# Patient Record
Sex: Female | Born: 2005 | Race: White | Hispanic: No | Marital: Single | Smoking: Never smoker
Health system: Southern US, Community
[De-identification: ages and names within clinical notes are randomized; demographics above are authoritative.]

## PROBLEM LIST (undated history)

## (undated) DIAGNOSIS — R519 Headache, unspecified: Secondary | ICD-10-CM

## (undated) DIAGNOSIS — Z9109 Other allergy status, other than to drugs and biological substances: Secondary | ICD-10-CM

## (undated) DIAGNOSIS — G43909 Migraine, unspecified, not intractable, without status migrainosus: Secondary | ICD-10-CM

## (undated) DIAGNOSIS — J45909 Unspecified asthma, uncomplicated: Secondary | ICD-10-CM

## (undated) DIAGNOSIS — F319 Bipolar disorder, unspecified: Secondary | ICD-10-CM

## (undated) DIAGNOSIS — L309 Dermatitis, unspecified: Secondary | ICD-10-CM

## (undated) DIAGNOSIS — F940 Selective mutism: Secondary | ICD-10-CM

## (undated) DIAGNOSIS — F419 Anxiety disorder, unspecified: Secondary | ICD-10-CM

## (undated) DIAGNOSIS — F909 Attention-deficit hyperactivity disorder, unspecified type: Secondary | ICD-10-CM

## (undated) DIAGNOSIS — F840 Autistic disorder: Secondary | ICD-10-CM

## (undated) HISTORY — DX: Headache, unspecified: R51.9

## (undated) HISTORY — DX: Migraine, unspecified, not intractable, without status migrainosus: G43.909

## (undated) HISTORY — PX: ADENOIDECTOMY: SUR15

## (undated) HISTORY — PX: OTHER SURGICAL HISTORY: SHX169

## (undated) HISTORY — PX: TONSILLECTOMY: SUR1361

---
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
## 2024-01-22 ENCOUNTER — Telehealth: Payer: Self-pay | Admitting: Pediatrics

## 2024-01-22 NOTE — Telephone Encounter (Signed)
 Patient's father stated that Dr. Kennyth spoke with patient's mother, who is a patient of Dr. Shaune currently and was told he could take on patient as a new patient. Can this be confirmed?

## 2024-01-23 NOTE — Telephone Encounter (Signed)
 Ok to schedule a new pt appt Per Dr Kennyth

## 2024-02-01 NOTE — Telephone Encounter (Signed)
 LVM to schedule pt.

## 2024-03-13 ENCOUNTER — Ambulatory Visit: Admitting: Family Medicine

## 2024-03-13 ENCOUNTER — Encounter: Payer: Self-pay | Admitting: Family Medicine

## 2024-03-13 VITALS — BP 130/84 | HR 72 | Temp 98.0°F | Ht 66.0 in | Wt 281.0 lb

## 2024-03-13 DIAGNOSIS — F419 Anxiety disorder, unspecified: Secondary | ICD-10-CM

## 2024-03-13 DIAGNOSIS — L732 Hidradenitis suppurativa: Secondary | ICD-10-CM

## 2024-03-13 DIAGNOSIS — N926 Irregular menstruation, unspecified: Secondary | ICD-10-CM | POA: Insufficient documentation

## 2024-03-13 DIAGNOSIS — Z131 Encounter for screening for diabetes mellitus: Secondary | ICD-10-CM | POA: Diagnosis not present

## 2024-03-13 DIAGNOSIS — G43909 Migraine, unspecified, not intractable, without status migrainosus: Secondary | ICD-10-CM | POA: Insufficient documentation

## 2024-03-13 DIAGNOSIS — Z13220 Encounter for screening for lipoid disorders: Secondary | ICD-10-CM

## 2024-03-13 DIAGNOSIS — J309 Allergic rhinitis, unspecified: Secondary | ICD-10-CM

## 2024-03-13 DIAGNOSIS — E039 Hypothyroidism, unspecified: Secondary | ICD-10-CM

## 2024-03-13 DIAGNOSIS — F840 Autistic disorder: Secondary | ICD-10-CM

## 2024-03-13 DIAGNOSIS — L309 Dermatitis, unspecified: Secondary | ICD-10-CM

## 2024-03-13 DIAGNOSIS — F909 Attention-deficit hyperactivity disorder, unspecified type: Secondary | ICD-10-CM | POA: Diagnosis not present

## 2024-03-13 DIAGNOSIS — L709 Acne, unspecified: Secondary | ICD-10-CM | POA: Diagnosis not present

## 2024-03-13 DIAGNOSIS — E063 Autoimmune thyroiditis: Secondary | ICD-10-CM | POA: Diagnosis not present

## 2024-03-13 DIAGNOSIS — J45909 Unspecified asthma, uncomplicated: Secondary | ICD-10-CM | POA: Insufficient documentation

## 2024-03-13 DIAGNOSIS — G43801 Other migraine, not intractable, with status migrainosus: Secondary | ICD-10-CM

## 2024-03-13 MED ORDER — FLUOCINOLONE ACETONIDE 0.01 % OT OIL: 0.5000 mL | TOPICAL_OIL | Freq: Two times a day (BID) | OTIC | 0 refills | Status: AC

## 2024-03-13 MED ORDER — ALBUTEROL SULFATE HFA 108 (90 BASE) MCG/ACT IN AERS: 2.0000 | INHALATION_SPRAY | Freq: Four times a day (QID) | RESPIRATORY_TRACT | 3 refills | Status: AC | PRN

## 2024-03-13 NOTE — Assessment & Plan Note (Signed)
 BMI 45.35 today. This is probably due to her untreated hypothyroidism though we are checking additional labs today as above.  She may benefit from starting a GLP agonist however we will hold off on this until we get above labs back.

## 2024-03-13 NOTE — Patient Instructions (Signed)
 It was very nice to see you today!  VISIT SUMMARY: Today, we addressed your concerns about weight gain, hormonal issues, migraines, and other ongoing health conditions. We have ordered comprehensive blood work and referred you to an adult endocrinologist for further management of your thyroid  and potential PCOS. We also discussed potential treatments for your migraines and provided new medications for your eczema and asthma.  YOUR PLAN: HASHIMOTO'S THYROIDITIS WITH HYPOTHYROIDISM: Your thyroid  condition has been causing symptoms like weight gain, fatigue, and irregular periods. We need updated blood work to better understand your thyroid  function. -Order comprehensive blood work including thyroid  function tests, testosterone, and other hormone levels. -Refer to adult endocrinologist, Dr. Wilburt, for further management.  OBESITY: Your recent weight gain may be related to your thyroid  condition and possible PCOS. -Evaluate blood work results to determine eligibility for weight loss medications. -Discuss potential use of injectable weight loss medications pending insurance approval and blood work results.  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 -Order hormone level tests including testosterone as part of comprehensive blood work.  MIGRAINE: You have been experiencing more frequent and severe migraines with nausea and light sensitivity. -Evaluate thyroid  function and other blood work results before starting migraine-specific treatment. -Discuss potential use of Emgality for migraine prevention.  ECZEMA WITH EAR CANAL INVOLVEMENT: Your eczema is affecting your ear canals, causing itching and irritation. -Prescribe fluocinonide ear drops for symptomatic relief of ear canal eczema.  HIDRADENITIS SUPPURATIVA: Your chronic skin condition is being managed by your dermatologist with Nimluvio and other  treatments. -Continue current treatment plan as directed by your dermatologist.  ACNE: Your acne may be related to hormonal imbalances and is being treated with several medications. -Continue current acne treatments including spironolactone, tretinoin, and Differin.  ASTHMA: Your asthma is generally well-controlled but can flare with seasonal changes. -Prescribe albuterol  inhaler for as-needed use.  No follow-ups on file.   Take care, Dr Kennyth  PLEASE NOTE:  If you had any lab tests, please let us  know if you have not heard back within a few days. You may see your results on mychart before we have a chance to review them but we will give you a call once they are reviewed by us .   If we ordered any referrals today, please let us  know if you have not heard from their office within the next week.   If you had any urgent prescriptions sent in today, please check with the pharmacy within an hour of our visit to make sure the prescription was transmitted appropriately.   Please try these tips to maintain a healthy lifestyle:  Eat at least 3 REAL meals and 1-2 snacks per day.  Aim for no more than 5 hours between eating.  If you eat breakfast, please do so within one hour of getting up.   Each meal should contain half fruits/vegetables, one quarter protein, and one quarter carbs (no bigger than a computer mouse)  Cut down on sweet beverages. This includes juice, soda, and sweet tea.   Drink at least 1 glass of water with each meal and aim for at least 8 glasses per day  Exercise at least 150 minutes every week.

## 2024-03-13 NOTE — Assessment & Plan Note (Signed)
 No recent flares.  Symptoms overall are manageable.  Will refill albuterol  inhaler as she has not had one for a few years.

## 2024-03-13 NOTE — Assessment & Plan Note (Signed)
 This is a chronic and ongoing issue.  Not currently on any medications for this though symptoms are currently manageable.  She will let us  know if she needs any further assistance with this.

## 2024-03-13 NOTE — Assessment & Plan Note (Signed)
 Currently following with psychiatry not currently on any medications for this though was on Vyvanse in the past.

## 2024-03-13 NOTE — Assessment & Plan Note (Signed)
 Patient has been off Synthroid  for a couple of years.  She is previously following with pediatric endocrinology however never had adequate treatment for her hypothyroidism.  She has several current symptoms including increased weight gain, dry skin, and hair loss.  Will check TSH, free T4, and free T3 today and also place referral for her to see a adult endocrinologist.  Will likely restart Synthroid  prior to her upcoming appointment with them depending on today's results.

## 2024-03-13 NOTE — Assessment & Plan Note (Signed)
 She is following with dermatology for this though this has been a significant issue for her over the last couple of years.  She is currently on Nemluvio which just started.  She also has desonide and triamcinolone to use as needed.  She does have irritation in bilateral EACs consistent with eczematous Otis externa.  Will start topical fluocinolone.  She will let us  know if not improving she can discuss further with dermatology.

## 2024-03-13 NOTE — Progress Notes (Signed)
 Jennifer Grimes is a 18 y.o. female who presents today for an office visit.  Assessment/Plan:   Chronic Problems Addressed Today: Hypothyroidism due to Hashimoto's thyroiditis Patient has been off Synthroid  for a couple of years.  She is previously following with pediatric endocrinology however never had adequate treatment for her hypothyroidism.  She has several current symptoms including increased weight gain, dry skin, and hair loss.  Will check TSH, free T4, and free T3 today and also place referral for her to see a adult endocrinologist.  Will likely restart Synthroid  prior to her upcoming appointment with them depending on today's results.  Eczema She is following with dermatology for this though this has been a significant issue for her over the last couple of years.  She is currently on Nemluvio which just started.  She also has desonide and triamcinolone to use as needed.  She does have irritation in bilateral EACs consistent with eczematous Otis externa.  Will start topical fluocinolone.  She will let us  know if not improving she can discuss further with dermatology.  Hidradenitis suppurativa Also follows with dermatology for this.  She is currently on clindamycin and BenzaClin's.  She is hoping to get some improvement with this with the [facility] as well.  ADHD Currently following with psychiatry not currently on any medications for this though was on Vyvanse in the past.  Anxiety Following with psychiatry for this.  She is on Celexa 10 mg daily which does seem to be helping with her symptoms.  Also has hydroxyzine  to use as needed for anxiety.  Allergic rhinitis This is a chronic and ongoing issue.  Not currently on any medications for this though symptoms are currently manageable.  She will let us  know if she needs any further assistance with this.  Migraines This has worsened recently.  As above we are checking labs as it is likely that her other comorbidities have made this  worse.  We did discuss trial of Aimovig or Emgality however we will hold off on this for now depending on results of above.  Acne Following with dermatology.  On Retin-A topically as well as topical clindamycin.  Also on spironolactone 50 mg daily  Asthma No recent flares.  Symptoms overall are manageable.  Will refill albuterol  inhaler as she has not had one for a few years.  Irregular periods Will concern for PCOS though may be due to her untreated hyperthyroidism.  We will check labs today.  Morbid obesity (HCC) BMI 45.35 today. This is probably due to her untreated hypothyroidism though we are checking additional labs today as above.  She may benefit from starting a GLP agonist however we will hold off on this until we get above labs back.  Flu shot declined.    Subjective:  HPI:  See assessment / plan for status of chronic conditions.   Discussed the use of AI scribe software for clinical note transcription with the patient, who gave verbal consent to proceed.  History of Present Illness Jennifer Grimes is a 18 year old female with hypothyroidism and Hashimoto's disease who presents with concerns about weight gain and hormonal issues.  She has a history of hypothyroidism and Hashimoto's disease, previously managed by pediatric endocrinologists. Despite being on medications like Synthroid  and another thyroid  medication starting with 'T', her thyroid  levels have not improved and have worsened over time. She stopped taking thyroid  medication about two years ago. Her last thyroid  check and blood work were also around that time. She reports  significant weight gain, including a 20-pound increase in one month, and irregular menstrual cycles, with periods absent for eight months followed by prolonged menstruation for three to four months.  She experiences symptoms of fatigue, low energy, and feeling tired all the time. Her mother notes that her weight continues to increase, and she feels  uncomfortable with her current size. There is no history of PCOS diagnosis. She has not been evaluated for PCOS, and her testosterone levels have not been checked recently.  She has a history of eczema and hidradenitis suppurativa (HS). She was previously on Dupixent for three to four years, which became less effective, and has recently started on a new medication, Nimluvio, about two months ago. Her HS is described as 'really bad'. She is under the care of a dermatologist at [facility]. Current treatments include spironolactone for acne, clindamycin, Benzaclin, tretinoin, and triamcinolone for eczema.  She experiences migraines, described as pain behind the eyes, worsening over the last two months. She reports that her migraines are severe enough that she needs to lie down in a dark room and that they make her feel nauseated and affect her vision. She has not been on specific migraine medication recently and manages with over-the-counter options like Tylenol .  She has a history of asthma, which is generally well-managed but can flare with seasonal changes. She has not had an albuterol  inhaler for a while but may need one for occasional use.  She reports ear issues, feeling 'soaked all the time' for about eight months. She had frequent ear infections as a child but did not require tubes. Her ears itch and feel dry.  She has a history of autism, diagnosed at age [age] or 46, and was homeschooled due to challenges in traditional school settings. She is not currently working or in school but is interested in pursuing a career in nails or hair.  She has a history of ADHD and anxiety, managed by a psychiatrist she has seen for many years. She recently started on Celexa 20 mg for sleep and anxiety. She also uses hydroxyzine  as needed for anxiety and itching.  She experiences dizziness and a sensation of feeling like she might pass out, which could be related to her thyroid  issues or anemia. She  is a regular water drinker.   ROS: Per HPI, otherwise a complete review of systems was negative.   PMH:  The following were reviewed and entered/updated in epic: Past Medical History:  Diagnosis Date   ADHD (attention deficit hyperactivity disorder)    Anxiety    Asthma    Autism    Eczema    Environmental allergies    Headache    Migraine    Selective mutism    Patient Active Problem List   Diagnosis Date Noted   Hypothyroidism due to Hashimoto's thyroiditis 03/13/2024   ADHD 03/13/2024   Anxiety 03/13/2024   Allergic rhinitis 03/13/2024   Migraines 03/13/2024   Asthma 03/13/2024   Acne 03/13/2024   Irregular periods 03/13/2024   Morbid obesity (HCC) 03/13/2024   Hidradenitis suppurativa 09/30/2021   Eczema 08/16/2018   Autism spectrum disorder 10/01/2016   Past Surgical History:  Procedure Laterality Date   ADENOIDECTOMY     rectal tear repair     TONSILLECTOMY      Family History  Problem Relation Age of Onset   Miscarriages / Stillbirths Mother    Hypertension Mother    Hyperlipidemia Mother    Asthma Mother    Depression  Mother    Arthritis Mother    Migraines Mother    Bipolar disorder Mother    Anxiety disorder Mother    ADD / ADHD Father    Anxiety disorder Sister    ADD / ADHD Sister    Asthma Sister    Asthma Sister    ADD / ADHD Brother    Asthma Brother    Bipolar disorder Brother    Anxiety disorder Brother    Depression Brother    Autism spectrum disorder Brother    Hypertension Maternal Grandmother    Intellectual disability Maternal Grandmother    Depression Maternal Grandmother    Arthritis Maternal Grandmother    Migraines Maternal Grandmother    Intellectual disability Paternal Grandmother    Hypertension Paternal Grandmother    Cancer Paternal Grandmother    Stroke Paternal Grandfather    Intellectual disability Paternal Grandfather    Hypertension Paternal Grandfather    Dementia Paternal Grandfather    Seizures Neg Hx     Schizophrenia Neg Hx     Medications- reviewed and updated Current Outpatient Medications  Medication Sig Dispense Refill   albuterol  (VENTOLIN  HFA) 108 (90 Base) MCG/ACT inhaler Inhale 2 puffs into the lungs every 6 (six) hours as needed for wheezing or shortness of breath. 24 g 3   citalopram (CELEXA) 20 MG tablet Take 20 mg by mouth daily.     clindamycin (CLEOCIN T) 1 % external solution Apply topically 2 (two) times daily.     clindamycin-benzoyl peroxide (BENZACLIN) gel Apply topically 2 (two) times daily.     desonide (DESOWEN) 0.05 % cream Apply daily as needed to areas of eczema on the face     Fluocinolone Acetonide 0.01 % OIL Place 0.5 mLs in ear(s) in the morning and at bedtime. 60 mL 0   hydrOXYzine  (ATARAX ) 25 MG tablet Take 1 tablet (25 mg total) by mouth every 8 (eight) hours as needed for itching. 30 tablet 0   nemolizumab-ilto (NEMLUVIO) 30 MG SQ injection Inject into the skin.     spironolactone (ALDACTONE) 50 MG tablet Take 50 mg by mouth daily.     tretinoin (RETIN-A) 0.025 % cream Apply topically at bedtime.     triamcinolone cream (KENALOG) 0.1 % Apply 1 Application topically 2 (two) times daily.     No current facility-administered medications for this visit.    Allergies-reviewed and updated No Known Allergies  Social History   Socioeconomic History   Marital status: Single    Spouse name: Not on file   Number of children: Not on file   Years of education: Not on file   Highest education level: Not on file  Occupational History   Not on file  Tobacco Use   Smoking status: Never   Smokeless tobacco: Never  Substance and Sexual Activity   Alcohol use: Never   Drug use: Never   Sexual activity: Not on file  Other Topics Concern   Not on file  Social History Narrative   Dijon is in the 10th grade, homeschooled; she struggles in school.    She lives with her parents and siblings.    She enjoys swimming, watching TV, and drawing.   Social Drivers  of Corporate investment banker Strain: Not on file  Food Insecurity: Not on file  Transportation Needs: Not on file  Physical Activity: Not on file  Stress: Not on file  Social Connections: Not on file  Objective:  Physical Exam: BP 130/84   Pulse 72   Temp 98 F (36.7 C) (Temporal)   Ht 5' 6 (1.676 m)   Wt 281 lb (127.5 kg)   LMP 02/18/2024   BMI 45.35 kg/m   Gen: No acute distress, resting comfortably HEENT: Bilateral EAC with erythema and dry skin CV: Regular rate and rhythm with no murmurs appreciated Pulm: Normal work of breathing, clear to auscultation bilaterally with no crackles, wheezes, or rhonchi Neuro: Grossly normal, moves all extremities Psych: Normal affect and thought content      Hailyn Zarr M. Kennyth, MD 03/13/2024 3:19 PM

## 2024-03-13 NOTE — Assessment & Plan Note (Signed)
 This has worsened recently.  As above we are checking labs as it is likely that her other comorbidities have made this worse.  We did discuss trial of Aimovig or Emgality however we will hold off on this for now depending on results of above.

## 2024-03-13 NOTE — Assessment & Plan Note (Signed)
 Following with psychiatry for this.  She is on Celexa 10 mg daily which does seem to be helping with her symptoms.  Also has hydroxyzine  to use as needed for anxiety.

## 2024-03-13 NOTE — Assessment & Plan Note (Signed)
 Will concern for PCOS though may be due to her untreated hyperthyroidism.  We will check labs today.

## 2024-03-13 NOTE — Assessment & Plan Note (Signed)
 Also follows with dermatology for this.  She is currently on clindamycin and BenzaClin's.  She is hoping to get some improvement with this with the [facility] as well.

## 2024-03-13 NOTE — Assessment & Plan Note (Signed)
 Following with dermatology.  On Retin-A topically as well as topical clindamycin.  Also on spironolactone 50 mg daily

## 2024-03-14 ENCOUNTER — Ambulatory Visit: Payer: Self-pay | Admitting: Family Medicine

## 2024-03-14 DIAGNOSIS — E039 Hypothyroidism, unspecified: Secondary | ICD-10-CM

## 2024-03-14 LAB — COMPREHENSIVE METABOLIC PANEL WITH GFR
ALT: 25 U/L (ref 0–35)
AST: 17 U/L (ref 0–37)
Albumin: 4.9 g/dL (ref 3.5–5.2)
Alkaline Phosphatase: 62 U/L (ref 47–119)
BUN: 12 mg/dL (ref 6–23)
CO2: 26 meq/L (ref 19–32)
Calcium: 9.8 mg/dL (ref 8.4–10.5)
Chloride: 104 meq/L (ref 96–112)
Creatinine, Ser: 0.6 mg/dL (ref 0.40–1.20)
GFR: 129.91 mL/min (ref >60.00)
Glucose, Bld: 87 mg/dL (ref 70–99)
Potassium: 3.8 meq/L (ref 3.5–5.1)
Sodium: 138 meq/L (ref 135–145)
Total Bilirubin: 0.5 mg/dL (ref 0.2–1.2)
Total Protein: 7.5 g/dL (ref 6.0–8.3)

## 2024-03-14 LAB — CBC
HCT: 40.7 % (ref 36.0–49.0)
Hemoglobin: 13.4 g/dL (ref 12.0–16.0)
MCHC: 32.9 g/dL (ref 31.0–37.0)
MCV: 87.8 fl (ref 78.0–98.0)
Platelets: 366 K/uL (ref 150.0–575.0)
RBC: 4.64 Mil/uL (ref 3.80–5.70)
RDW: 13.2 % (ref 11.4–15.5)
WBC: 6.4 K/uL (ref 4.5–13.5)

## 2024-03-14 LAB — IBC + FERRITIN
Ferritin: 17.9 ng/mL (ref 10.0–291.0)
Iron: 58 ug/dL (ref 42–145)
Saturation Ratios: 11.9 % — ABNORMAL LOW (ref 20.0–50.0)
TIBC: 485.8 ug/dL — ABNORMAL HIGH (ref 250.0–450.0)
Transferrin: 347 mg/dL (ref 212.0–360.0)

## 2024-03-14 LAB — FSH/LH
FSH: 9.2 m[IU]/mL
LH: 9.5 m[IU]/mL

## 2024-03-14 LAB — T3, FREE: T3, Free: 3.9 pg/mL (ref 2.3–4.2)

## 2024-03-14 LAB — FOLATE: Folate: 10 ng/mL (ref >5.9)

## 2024-03-14 LAB — VITAMIN B12: Vitamin B-12: 190 pg/mL — ABNORMAL LOW (ref 211–911)

## 2024-03-14 LAB — LIPID PANEL
Cholesterol: 223 mg/dL — ABNORMAL HIGH (ref 0–200)
HDL: 35.3 mg/dL — ABNORMAL LOW (ref >39.00)
LDL Cholesterol: 162 mg/dL — ABNORMAL HIGH (ref 0–99)
NonHDL: 187.77
Total CHOL/HDL Ratio: 6
Triglycerides: 130 mg/dL (ref 0.0–149.0)
VLDL: 26 mg/dL (ref 0.0–40.0)

## 2024-03-14 LAB — HEMOGLOBIN A1C: Hgb A1c MFr Bld: 5.3 % (ref 4.6–6.5)

## 2024-03-14 LAB — TESTOSTERONE: Testosterone: 61.7 ng/dL — ABNORMAL HIGH (ref 15.00–40.00)

## 2024-03-14 LAB — TSH: TSH: 10.18 u[IU]/mL — ABNORMAL HIGH (ref 0.40–5.00)

## 2024-03-14 LAB — T4, FREE: Free T4: 0.97 ng/dL (ref 0.60–1.60)

## 2024-03-14 LAB — VITAMIN D 25 HYDROXY (VIT D DEFICIENCY, FRACTURES): VITD: 9.12 ng/mL — ABNORMAL LOW (ref 30.00–100.00)

## 2024-03-14 NOTE — Progress Notes (Signed)
 Her TSH is not at goal.  Recommend we restart her Synthroid  50 mcg daily though it is important that she follow-up with endocrinology soon to discuss ongoing management for this.  I would like to recheck her TSH in 4 to 6 weeks.    Her cholesterol is elevated however not at the point where we need to start meds.  We can recheck this again in a year though it is important she continue to work on diet and exercise.  Her B12 is low.  Recommend starting 1000 mcg daily.  They can get this over-the-counter.  I would like to recheck again in 3 months.  Her vitamin D is extremely low.  Recommend starting 2000 IUs daily.  We should recheck this in 3 months.  Her testosterone is elevated.  This indicates she likely has PCOS.  She can discuss this further with her endocrinologist.  All of her other labs are at goal and we can recheck again in a year or so.  I would like for her to follow back up with me in about 3 months or sooner if they have any other concerns.

## 2024-03-18 ENCOUNTER — Other Ambulatory Visit: Payer: Self-pay | Admitting: *Deleted

## 2024-03-18 DIAGNOSIS — E063 Autoimmune thyroiditis: Secondary | ICD-10-CM

## 2024-03-18 MED ORDER — LEVOTHYROXINE SODIUM 50 MCG PO TABS: 50.0000 ug | ORAL_TABLET | Freq: Every day | ORAL | 3 refills | Status: AC

## 2024-03-28 ENCOUNTER — Ambulatory Visit: Admitting: Family Medicine

## 2024-03-28 VITALS — Temp 98.6°F | Resp 18 | Ht 67.0 in | Wt 281.0 lb

## 2024-03-28 DIAGNOSIS — E063 Autoimmune thyroiditis: Secondary | ICD-10-CM | POA: Diagnosis not present

## 2024-03-28 DIAGNOSIS — N926 Irregular menstruation, unspecified: Secondary | ICD-10-CM

## 2024-03-28 DIAGNOSIS — E559 Vitamin D deficiency, unspecified: Secondary | ICD-10-CM

## 2024-03-28 DIAGNOSIS — E785 Hyperlipidemia, unspecified: Secondary | ICD-10-CM

## 2024-03-28 DIAGNOSIS — R55 Syncope and collapse: Secondary | ICD-10-CM

## 2024-03-28 DIAGNOSIS — L918 Other hypertrophic disorders of the skin: Secondary | ICD-10-CM

## 2024-03-28 DIAGNOSIS — E538 Deficiency of other specified B group vitamins: Secondary | ICD-10-CM

## 2024-03-28 MED ORDER — ZEPBOUND 2.5 MG/0.5ML ~~LOC~~ SOAJ: 2.5000 mg | SUBCUTANEOUS | 0 refills | Status: AC

## 2024-03-28 NOTE — Assessment & Plan Note (Addendum)
 BMI 44 today with comorbidities.  She will be a good candidate for GLP agonist though did discuss with patient and mother today that insurance may not pay for this.  Will try Zepbound 2.5 mg weekly.  We discussed potential side effects.  They will follow-up with us  in a few weeks via MyChart and we can adjust the dose as needed.

## 2024-03-28 NOTE — Progress Notes (Signed)
 Jennifer Grimes is a 18 y.o. female who presents today for an office visit.  Assessment/Plan:  New/Acute Problems: Dizziness / Syncope History is consistent with vasovagal episode given her prodromal symptoms of feeling warm and flushed however she does note postural dizziness for the last several months.  Her EKG today shows sinus arrhythmia which may indicate high vagal tone.  Orthostatic vital signs notable for 30 point increase in heart rate with standing though no orthostatic hypotension.  We did check labs a couple weeks ago without any significant abnormalities that would explain her symptoms-do not think we need to repeat this today.  Discussed with patient that differential at this point includes vasovagal syncope though POTS is also possible.  We will refer to cardiology for further evaluation.  We discussed reasons to return to care and seek emergent care.  Inflamed Skin Tags Cryotherapy applied today.  See below procedure note.  Chronic Problems Addressed Today: Morbid obesity (HCC) BMI 44 today with comorbidities.  She will be a good candidate for GLP agonist though did discuss with patient and mother today that insurance may not pay for this.  Will try Zepbound 2.5 mg weekly.  We discussed potential side effects.  They will follow-up with us  in a few weeks via MyChart and we can adjust the dose as needed.  Irregular periods Elevated testosterone on recent labs and likely has PCOS.  We did refer her to endocrinology and she will call to schedule appointment soon.  Hopefully will have some improvement with above GLP agonist.  Hypothyroidism due to Hashimoto's thyroiditis She was recently restarted on Synthroid  50 mcg daily.  Too early to recheck TSH today though will defer further management endocrinology.  Will give contact information for her to call to schedule appointment today.  B12 deficiency On B12 supplementation 1000 mcg daily.  We can recheck in 3 months.   Vitamin D  deficiency On vitamin D supplementation 2000 IUs daily.  Recheck in 3 months.  Dyslipidemia Last LDL 162.  No indication to start statin at this point.  Hopefully should improve with weight loss and lifestyle interventions.  We can recheck again in a year.     Subjective:  HPI:  See assessment / plan for status of chronic conditions.  Patient is here today for follow-up.  I last saw her 2 weeks ago for her initial visit here.  At that visit we had extensive discussion regarding all of her comorbidities.  We additionally checked labs at that time which were notable for elevated TSH and we restarted her on Synthroid  50 mcg daily.  She was also found to have low B12 and vitamin D and we recommended starting supplementation with 1000 mcg daily B12 and 2000 IUs daily on vitamin D.  Her testosterone was elevated and we had recommended she discuss this further with endocrinology for potential PCOS diagnosis.  Also at our last visit we started topical fluocinolone for otitis externa due to eczema.   Discussed the use of AI scribe software for clinical note transcription with the patient, who gave verbal consent to proceed.  History of Present Illness Jennifer Grimes is a 18 year old female with hypothyroidism and suspected PCOS who presents for a follow-up visit.  She returns for a follow-up visit after her initial consultation two weeks ago. During the previous visit, she was found to have elevated TSH levels and was started on Synthroid  50 mcg daily. She also had low B12 and vitamin D levels, for which she  began supplementation with 1000 mcg of B12 and 2000 IU of vitamin D daily. Her testosterone levels were elevated, and she was advised to consult with endocrinology for potential PCOS diagnosis.  She has noticed a inflamed skin tag on her right neck the last few days that has become more painful.  Yesterday, she experienced an episode of dizziness, feeling very hot, and subsequently fainted while  at a friend's house. She describes the episode as feeling 'really hot' and 'really dizzy' before losing consciousness for a few seconds. Her mother confirms that she was hydrated at the time. This is the first time she has actually fainted, although she has been experiencing dizziness daily for the past two to three months, which has been worsening. The dizziness often occurs when she stands up. No palpitations, chest pain, or shortness of breath.  Her mother mentions that she drinks a lot of water, and she is concerned about the possibility of overhydration, although recent labs did not indicate any issues. She has not yet heard back from the endocrinologist regarding the PCOS referral.         Objective:  Physical Exam: Temp 98.6 F (37 C) (Temporal)   Resp 18   Ht 5' 7 (1.702 m)   Wt 281 lb (127.5 kg)   LMP 03/14/2024   SpO2 99%   BMI 44.01 kg/m   Orthostatic VS: Supine: 116/76, 66 Sitting: 116/76, 77 Standing: 128/83, 108    Gen: No acute distress, resting comfortably CV: Regular rate and rhythm with no murmurs appreciated Pulm: Normal work of breathing, clear to auscultation bilaterally with no crackles, wheezes, or rhonchi Skin: Inflamed9 skin tag right neck Neuro: Grossly normal, moves all extremities Psych: Normal affect and thought content  EKG: Sinus arrhythmia.  Cryotherapy Procedure Note  Pre-operative Diagnosis: Inflamed Skin Tag  Locations: Right Neck  Indications: Therapeutic  Procedure Details  Patient informed of risks (permanent scarring, infection, light or dark discoloration, bleeding, infection, weakness, numbness and recurrence of the lesion) and benefits of the procedure and verbal informed consent obtained.  The areas are treated with liquid nitrogen therapy, frozen until ice ball extended 3 mm beyond lesion, allowed to thaw, and treated again. The patient tolerated procedure well.  The patient was instructed on post-op care, warned that there may  be blister formation, redness and pain. Recommend OTC analgesia as needed for pain.  Condition: Stable  Complications: none.  Time Spent: 50 minutes of total time was spent on the date of the encounter performing the following actions: chart review prior to seeing the patient, obtaining history, performing a medically necessary exam, counseling on the treatment plan, placing orders, and documenting in our EHR.  This does not include time spent performing above procedure.        Worth HERO. Kennyth, MD 03/28/2024 10:14 AM

## 2024-03-28 NOTE — Assessment & Plan Note (Signed)
 She was recently restarted on Synthroid  50 mcg daily.  Too early to recheck TSH today though will defer further management endocrinology.  Will give contact information for her to call to schedule appointment today.

## 2024-03-28 NOTE — Assessment & Plan Note (Signed)
 Elevated testosterone on recent labs and likely has PCOS.  We did refer her to endocrinology and she will call to schedule appointment soon.  Hopefully will have some improvement with above GLP agonist.

## 2024-03-28 NOTE — Assessment & Plan Note (Signed)
 On vitamin D supplementation 2000 IUs daily.  Recheck in 3 months.

## 2024-03-28 NOTE — Assessment & Plan Note (Signed)
 Last LDL 162.  No indication to start statin at this point.  Hopefully should improve with weight loss and lifestyle interventions.  We can recheck again in a year.

## 2024-03-28 NOTE — Assessment & Plan Note (Signed)
 On B12 supplementation 1000 mcg daily.  We can recheck in 3 months.

## 2024-03-28 NOTE — Patient Instructions (Addendum)
 It was very nice to see you today!  VISIT SUMMARY: Today, we addressed your dizziness and fainting episodes, discussed weight loss medication for your PCOS symptoms, and reviewed your current treatments for hypothyroidism, vitamin deficiencies, and hypercholesterolemia. We also treated a skin tag with cryotherapy.  YOUR PLAN: SYNCOPE AND DIZZINESS: You have been experiencing dizziness and fainting, which could be due to vasovagal syncope or POTS. - Your EKG today indicates that she may have high vagal tone in your blood pressure readings indicate that she may have Postural Orthostatic Tachycardia Syndrome (POTS) - We wil refer to cardiology for further evaluation  MORBID OBESITY: Weight loss medication may help with your PCOS symptoms. -We discussed Zepbound, which is administered via a pen with a small needle. We will start you on a low dose and adjust as needed. -We will check with your insurance about coverage and may need prior authorization.  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 -We have referred you to endocrinology for further evaluation. Please contact them to schedule your appointment. -We prescribed Zepbound to help manage your PCOS symptoms.  HYPOTHYROIDISM (ELEVATED TSH): You are currently taking Synthroid  50 mcg daily for your hypothyroidism. -It is too early to recheck your thyroid  function tests. -Please call endocrinology to schedule appointment at [phone]  HYPERCHOLESTEROLEMIA: Weight loss medication may also help with your cholesterol levels. -We discussed the potential benefits of weight loss medication for managing your cholesterol.  VITAMIN B12 AND VITAMIN D DEFICIENCY: You are currently taking supplements for your vitamin deficiencies. -Continue taking Vitamin B12 1000 mcg daily and Vitamin D 2000 IU daily. It is too early to recheck your levels.  SKIN  TAG (TREATED WITH CRYOTHERAPY): A skin tag causing discomfort was treated with cryotherapy. -Apply paper tape or a Band-Aid to protect the area.  Return in about 3 months (around 06/28/2024) for Follow Up.   Take care, Dr Kennyth  PLEASE NOTE:  If you had any lab tests, please let us  know if you have not heard back within a few days. You may see your results on mychart before we have a chance to review them but we will give you a call once they are reviewed by us .   If we ordered any referrals today, please let us  know if you have not heard from their office within the next week.   If you had any urgent prescriptions sent in today, please check with the pharmacy within an hour of our visit to make sure the prescription was transmitted appropriately.   Please try these tips to maintain a healthy lifestyle:  Eat at least 3 REAL meals and 1-2 snacks per day.  Aim for no more than 5 hours between eating.  If you eat breakfast, please do so within one hour of getting up.   Each meal should contain half fruits/vegetables, one quarter protein, and one quarter carbs (no bigger than a computer mouse)  Cut down on sweet beverages. This includes juice, soda, and sweet tea.   Drink at least 1 glass of water with each meal and aim for at least 8 glasses per day  Exercise at least 150 minutes every week.

## 2024-04-22 ENCOUNTER — Encounter: Payer: Self-pay | Admitting: Family Medicine

## 2024-04-28 NOTE — Telephone Encounter (Signed)
 We can put in referral to sleep medicine for a sleep study though her insurance may require a visit with us  first.  Tanya Crothers M. Kennyth, MD 04/28/2024 7:58 AM

## 2024-04-28 NOTE — Telephone Encounter (Signed)
 Called and left a message for patient to call the office to review Dr. Carroll note. A MyChart message was also sent.

## 2024-05-12 NOTE — Progress Notes (Deleted)
 CARDIOLOGY CONSULT NOTE       Patient ID: Jennifer Grimes MRN: [id number] DOB/AGE: [birth year] 19 y.o.  Referring Physician: Kennyth Primary Physician: Jennifer Worth HERO, MD Primary Cardiologist: New Reason for Consultation: Dizziness/Syncope   HPI:  18 y.o. referred by Dr Jennifer for dizziness and syncope. History of ADHD, GAD, Asthma, and Autism. She has had Hashimoto thyroiditis and restarted on Synthroid  this year with TSH 10.18 03/13/24 A1c normal. Not azotemic with normal BMET. And Hct 40.7 ECG in primary office 03/28/24 Sinus arrhythmia rate 63 normal ST segments and QT. His note indicated vasovagal prodrome. Not postural in office but 19 bpm increase in HR with standing. She is morbidly obese at BMI 45.35 She sees psychiatry for GAD and ADHD Previously on Vyvanse. She is on Celexa and hydroxyzine  for anxiety. Skin problems include eczema and hidradenitis suppurativa Has used Dupixent /Nemluvioin past and courses of doxycyline. Elevated testosterone  with concern for PCOS and irregular menses. She uses aldactone as an anti androgen Currently on Zepbound  for weight loss.   ***  ROS All other systems reviewed and negative except as noted above  Past Medical History:  Diagnosis Date   ADHD (attention deficit hyperactivity disorder)    Anxiety    Asthma    Autism    Eczema    Environmental allergies    Headache    Migraine    Selective mutism     Family History  Problem Relation Age of Onset   Miscarriages / Stillbirths Mother    Hypertension Mother    Hyperlipidemia Mother    Asthma Mother    Depression Mother    Arthritis Mother    Migraines Mother    Bipolar disorder Mother    Anxiety disorder Mother    ADD / ADHD Father    Anxiety disorder Sister    ADD / ADHD Sister    Asthma Sister    Asthma Sister    ADD / ADHD Brother    Asthma Brother    Bipolar disorder Brother    Anxiety disorder Brother    Depression Brother    Autism spectrum disorder Brother     Hypertension Maternal Grandmother    Intellectual disability Maternal Grandmother    Depression Maternal Grandmother    Arthritis Maternal Grandmother    Migraines Maternal Grandmother    Intellectual disability Paternal Grandmother    Hypertension Paternal Grandmother    Cancer Paternal Grandmother    Stroke Paternal Grandfather    Intellectual disability Paternal Grandfather    Hypertension Paternal Grandfather    Dementia Paternal Grandfather    Seizures Neg Hx    Schizophrenia Neg Hx     Social History   Socioeconomic History   Marital status: Single    Spouse name: Not on file   Number of children: Not on file   Years of education: Not on file   Highest education level: Not on file  Occupational History   Not on file  Tobacco Use   Smoking status: Never   Smokeless tobacco: Never  Substance and Sexual Activity   Alcohol use: Never   Drug use: Never   Sexual activity: Not on file  Other Topics Concern   Not on file  Social History Narrative   [name] is in the 10th grade, homeschooled; she struggles in school.    She lives with her parents and siblings.    She enjoys swimming, watching TV, and drawing.   Social Drivers of Health   Tobacco Use: Low  Risk (05/01/2024)   Received from [facility]   Patient History    Smoking Tobacco Use: Never    Smokeless Tobacco Use: Never    Passive Exposure: Not on file  Financial Resource Strain: Not on file  Food Insecurity: Not on file  Transportation Needs: Not on file  Physical Activity: Not on file  Stress: Not on file  Social Connections: Not on file  Intimate Partner Violence: Not on file  Depression (PHQ2-9): Low Risk (03/28/2024)   Depression (PHQ2-9)    PHQ-2 Score: 0  Alcohol Screen: Not on file  Housing: Not on file  Utilities: Not on file  Health Literacy: Not on file    Past Surgical History:  Procedure Laterality Date   ADENOIDECTOMY     rectal tear repair     TONSILLECTOMY       Current  Medications[1]    Physical Exam: There were no vitals taken for this visit.    Affect appropriate Overweight  HEENT: normal Neck supple with no adenopathy JVP normal no bruits no thyromegaly Lungs clear with no wheezing and good diaphragmatic motion Heart:  S1/S2 no murmur, no rub, gallop or click PMI normal Abdomen: benighn, BS positve, no tenderness, no AAA no bruit.  No HSM or HJR Distal pulses intact with no bruits No edema Neuro non-focal Eczema with some scarring *** No muscular weakness   Labs:   Lab Results  Component Value Date   WBC 6.4 03/13/2024   HGB 13.4 03/13/2024   HCT 40.7 03/13/2024   MCV 87.8 03/13/2024   PLT 366.0 03/13/2024   No results for input(s): NA, K, CL, CO2, BUN, CREATININE, CALCIUM, PROT, BILITOT, ALKPHOS, ALT, AST, GLUCOSE in the last 168 hours.  Invalid input(s): LABALBU No results found for: CKTOTAL, CKMB, CKMBINDEX, TROPONINI  Lab Results  Component Value Date   CHOL 223 (H) 03/13/2024   Lab Results  Component Value Date   HDL 35.30 (L) 03/13/2024   Lab Results  Component Value Date   LDLCALC 162 (H) 03/13/2024   Lab Results  Component Value Date   TRIG 130.0 03/13/2024   Lab Results  Component Value Date   CHOLHDL 6 03/13/2024   No results found for: LDLDIRECT    Radiology: No results found.  EKG: 02/2024 Sinus arrhythmia rate 63 otherwise normal ***   ASSESSMENT AND PLAN:   Dizziness/Syncope:  no signs of severe cardiac issues. Needs TTE to r/o structural heart dx. Not postural and elevated HR with standing more likely reflects obesity.  Eczema:  per derm continue Nemluvio, clindamycin gel PCOS:  on aldactone BMET is fine Thyroiditis:  on synthroid  low dose f/u TSH primary ADHD/GAD:  f/u psychiatry on Celexa Weight loss:  on Zepbound  discussed exercise and low carb diet  Asthma no recent flares PRN albuterol  inhaler   TTE  F/U cardiology PRN   Signed: Maude Emmer 05/12/2024, 4:10 PM      [1]  Current Outpatient Medications:    albuterol  (VENTOLIN  HFA) 108 (90 Base) MCG/ACT inhaler, Inhale 2 puffs into the lungs every 6 (six) hours as needed for wheezing or shortness of breath., Disp: 24 g, Rfl: 3   citalopram (CELEXA) 20 MG tablet, Take 20 mg by mouth daily., Disp: , Rfl:    clindamycin (CLEOCIN T) 1 % external solution, Apply topically 2 (two) times daily., Disp: , Rfl:    clindamycin-benzoyl peroxide (BENZACLIN) gel, Apply topically 2 (two) times daily., Disp: , Rfl:    desonide (DESOWEN) 0.05 %  cream, Apply daily as needed to areas of eczema on the face, Disp: , Rfl:    Fluocinolone  Acetonide 0.01 % OIL, Place 0.5 mLs in ear(s) in the morning and at bedtime., Disp: 60 mL, Rfl: 0   hydrOXYzine  (ATARAX ) 25 MG tablet, Take 1 tablet (25 mg total) by mouth every 8 (eight) hours as needed for itching., Disp: 30 tablet, Rfl: 0   levothyroxine  (SYNTHROID ) 50 MCG tablet, Take 1 tablet (50 mcg total) by mouth daily., Disp: 30 tablet, Rfl: 3   nemolizumab-ilto (NEMLUVIO) 30 MG SQ injection, Inject into the skin., Disp: , Rfl:    spironolactone (ALDACTONE) 50 MG tablet, Take 50 mg by mouth daily., Disp: , Rfl:    tirzepatide  (ZEPBOUND ) 2.5 MG/0.5ML Pen, Inject 2.5 mg into the skin once a week., Disp: 6 mL, Rfl: 0   tretinoin (RETIN-A) 0.025 % cream, Apply topically at bedtime., Disp: , Rfl:    triamcinolone cream (KENALOG) 0.1 %, Apply 1 Application topically 2 (two) times daily., Disp: , Rfl:

## 2024-05-16 ENCOUNTER — Other Ambulatory Visit: Payer: Self-pay | Admitting: Family Medicine

## 2024-05-16 DIAGNOSIS — G4730 Sleep apnea, unspecified: Secondary | ICD-10-CM

## 2024-05-26 ENCOUNTER — Ambulatory Visit: Attending: Cardiovascular Disease | Admitting: Cardiovascular Disease

## 2024-06-30 ENCOUNTER — Ambulatory Visit: Admitting: Family Medicine

## 2024-07-18 ENCOUNTER — Ambulatory Visit: Admitting: Cardiovascular Disease

## 2024-11-10 ENCOUNTER — Ambulatory Visit: Admitting: "Endocrinology
